# Patient Record
Sex: Female | Born: 1985 | Race: White | Hispanic: No | Marital: Single | Smoking: Never smoker
Health system: Southern US, Community
[De-identification: ages and names within clinical notes are randomized; demographics above are authoritative.]

---

## 2016-06-23 ENCOUNTER — Emergency Department (HOSPITAL_COMMUNITY): Payer: BLUE CROSS/BLUE SHIELD

## 2016-06-23 ENCOUNTER — Encounter (HOSPITAL_COMMUNITY): Payer: Self-pay | Admitting: Emergency Medicine

## 2016-06-23 ENCOUNTER — Emergency Department (HOSPITAL_COMMUNITY)
Admission: EM | Admit: 2016-06-23 | Discharge: 2016-06-23 | Disposition: A | Discharge status: Home / Self Care | Payer: BLUE CROSS/BLUE SHIELD | Attending: Emergency Medicine | Admitting: Emergency Medicine

## 2016-06-23 DIAGNOSIS — R0789 Other chest pain: Secondary | ICD-10-CM | POA: Diagnosis not present

## 2016-06-23 DIAGNOSIS — Z79899 Other long term (current) drug therapy: Secondary | ICD-10-CM | POA: Insufficient documentation

## 2016-06-23 DIAGNOSIS — R079 Chest pain, unspecified: Secondary | ICD-10-CM

## 2016-06-23 LAB — D-DIMER, QUANTITATIVE: D-Dimer, Quant: <0.27 ug/mL-FEU (ref 0.00–0.50)

## 2016-06-23 LAB — BASIC METABOLIC PANEL
ANION GAP: 7 (ref 5–15)
BUN: 12 mg/dL (ref 6–20)
CHLORIDE: 106 mmol/L (ref 101–111)
CO2: 25 mmol/L (ref 22–32)
Calcium: 9.8 mg/dL (ref 8.9–10.3)
Creatinine, Ser: 0.86 mg/dL (ref 0.44–1.00)
GFR calc non Af Amer: >60 mL/min (ref >60)
Glucose, Bld: 95 mg/dL (ref 65–99)
POTASSIUM: 3.8 mmol/L (ref 3.5–5.1)
Sodium: 138 mmol/L (ref 135–145)

## 2016-06-23 LAB — I-STAT TROPONIN, ED: Troponin i, poc: 0 ng/mL (ref 0.00–0.08)

## 2016-06-23 LAB — CBC
HCT: 39 % (ref 36.0–46.0)
HEMOGLOBIN: 13.4 g/dL (ref 12.0–15.0)
MCH: 26.9 pg (ref 26.0–34.0)
MCHC: 34.4 g/dL (ref 30.0–36.0)
MCV: 78.2 fL (ref 78.0–100.0)
Platelets: 290 10*3/uL (ref 150–400)
RBC: 4.99 MIL/uL (ref 3.87–5.11)
RDW: 12.9 % (ref 11.5–15.5)
WBC: 10.7 10*3/uL — ABNORMAL HIGH (ref 4.0–10.5)

## 2016-06-23 MED ORDER — PREDNISONE 20 MG PO TABS: 40.0000 mg | ORAL_TABLET | Freq: Every day | ORAL | Status: AC

## 2016-06-23 MED ORDER — ALBUTEROL SULFATE HFA 108 (90 BASE) MCG/ACT IN AERS
2.0000 | INHALATION_SPRAY | Freq: Once | RESPIRATORY_TRACT | Status: AC
  Administered 2016-06-23: 2 via RESPIRATORY_TRACT
  Filled 2016-06-23: qty 6.7

## 2016-06-23 MED ORDER — NAPROXEN 500 MG PO TABS
500.0000 mg | ORAL_TABLET | Freq: Once | ORAL | Status: AC
  Administered 2016-06-23: 500 mg via ORAL
  Filled 2016-06-23: qty 1

## 2016-06-23 MED ORDER — IPRATROPIUM-ALBUTEROL 0.5-2.5 (3) MG/3ML IN SOLN
3.0000 mL | Freq: Once | RESPIRATORY_TRACT | Status: AC
  Administered 2016-06-23: 3 mL via RESPIRATORY_TRACT
  Filled 2016-06-23: qty 3

## 2016-06-23 MED ORDER — LORAZEPAM 1 MG PO TABS: 0.5000 mg | ORAL_TABLET | Freq: Three times a day (TID) | ORAL | Status: AC | PRN

## 2016-06-23 NOTE — ED Provider Notes (Signed)
CSN: [id number]     Arrival date & time 06/23/16  0026 History   First MD Initiated Contact with Patient 06/23/16 0059     Chief Complaint  Patient presents with  . Chest Pain  . Anxiety     (Consider location/radiation/quality/duration/timing/severity/associated sxs/prior Treatment) HPI Comments: 30 year old female with no significant past medical history presents to the emergency department for evaluation of central chest pain, described as a pressure, which has been constant since yesterday morning upon waking. Patient states that pain is slightly worse with deep breathing. Symptoms were preceded by nasal congestion and rhinorrhea which the patient attributed to allergies. She states that her pain and pressure sensation worsened this evening. She is unsure of whether her symptoms may be secondary to anxiety. She denies taking any medications for anxiety on a regular basis. She states that she has been eating regularly and sleeping 7-8 hours a night. She does not feel as though she is under increased stress. She has had no associated fever, syncope, hemoptysis, leg swelling, lightheadedness, or cough. No recent surgeries or hospitalizations. No personal or family history of DVT/PE. Patient is on birth control.  Patient is a 30 y.o. female presenting with chest pain and anxiety. The history is provided by the patient. No language interpreter was used.  Chest Pain Associated symptoms: anxiety   Associated symptoms: no fever and not vomiting   Anxiety Associated symptoms include chest pain and congestion. Pertinent negatives include no fever or vomiting.    History reviewed. No pertinent past medical history. History reviewed. No pertinent past surgical history. No family history on file. Social History  Substance Use Topics  . Smoking status: Never Smoker   . Smokeless tobacco: None  . Alcohol Use: 1.8 oz/week    3 Shots of liquor per week   OB History    No data available       Review of Systems  Constitutional: Negative for fever.  HENT: Positive for congestion.   Respiratory: Positive for chest tightness.   Cardiovascular: Positive for chest pain. Negative for leg swelling.  Gastrointestinal: Negative for vomiting and diarrhea.  Neurological: Negative for syncope and light-headedness.  All other systems reviewed and are negative.   Allergies  Sulfa antibiotics  Home Medications   Prior to Admission medications   Medication Sig Start Date End Date Taking? Authorizing Provider  etonogestrel (NEXPLANON) 68 MG IMPL implant 1 each by Subdermal route once.   Yes Historical Provider, MD  Multiple Vitamin (MULTIVITAMIN WITH MINERALS) TABS tablet Take 1 tablet by mouth daily.   Yes Historical Provider, MD   BP 160/94 mmHg  Pulse 102  Temp(Src) 98.2 F (36.8 C) (Oral)  Resp 15  Ht 5\' 7"  (1.702 m)  Wt 120.203 kg  BMI 41.50 kg/m2  SpO2 99%   Physical Exam  Constitutional: She is oriented to person, place, and time. She appears well-developed and well-nourished. No distress.  Nontoxic appearing and in no distress.  HENT:  Head: Normocephalic and atraumatic.  Eyes: Conjunctivae and EOM are normal. No scleral icterus.  Neck: Normal range of motion.  Cardiovascular: Regular rhythm and intact distal pulses.   Borderline tachycardia  Pulmonary/Chest: Effort normal and breath sounds normal. No respiratory distress. She has no wheezes. She has no rales.  Respirations even and unlabored. Lungs clear to auscultation bilaterally.  Musculoskeletal: Normal range of motion.  Neurological: She is alert and oriented to person, place, and time. She exhibits normal muscle tone. Coordination normal.  Skin: Skin is warm and  dry. No rash noted. She is not diaphoretic. No erythema. No pallor.  Psychiatric: She has a normal mood and affect. Her behavior is normal.  Nursing note and vitals reviewed.   ED Course  Procedures (including critical care time) Labs  Review Labs Reviewed  CBC - Abnormal; Notable for the following:    WBC 10.7 (*)    All other components within normal limits  BASIC METABOLIC PANEL  D-DIMER, QUANTITATIVE ([facility])  Rosezena SensorI-STAT TROPOININ, ED    Imaging Review Dg Chest 2 View  06/23/2016  CLINICAL DATA:  Initial evaluation for acute mid upper chest pain with shortness of breath. EXAM: CHEST  2 VIEW COMPARISON:  None. FINDINGS: The cardiac and mediastinal silhouettes are within normal limits. The lungs are normally inflated. Mild scattered peribronchial thickening. No airspace consolidation, pleural effusion, or pulmonary edema is identified. There is no pneumothorax. No acute osseous abnormality identified. IMPRESSION: Mild scattered peribronchial thickening, which may reflect acute bronchiolitis and/ or reactive airways disease. Electronically Signed   By: Rise MuBenjamin  McClintock M.D.   On: 06/23/2016 01:14   I have personally reviewed and evaluated these images and lab results as part of my medical decision-making.   ED ECG REPORT   Date: 06/23/2016  Rate: 100  Rhythm: sinus tachycardia  QRS Axis: normal  Intervals: Normal QTc  ST/T Wave abnormalities: normal  Conduction Disutrbances:none  Narrative Interpretation: sinus tachycardia; no STEMI  Old EKG Reviewed: none available  I have personally reviewed the EKG tracing and agree with the computerized printout as noted.    0230 - Heart score 0-1 depending upon suspicion of history c/w low risk of acute coronary event. [facility]; will dimer.  MDM   Final diagnoses:  Chest pain, unspecified chest pain type    30 year old female presents to the emergency department for evaluation of one day of central chest tightness and pressure. Symptoms preceded by nasal congestion. X-ray consistent with acute bronchiolitis which may account for patient's symptoms today as well as her mild leukocytosis of 10.7. Patient is a young, otherwise healthy female. She has no risk  factors for cardiovascular disease. Heart score is 0-1 depending on suspicion, correlated with low risk of acute coronary event. She has a negative cardiac workup today with a nonischemic EKG and negative troponin. D-dimer also obtained which is negative. Patient is, overall, low risk for PE.  Symptoms managed in the emergency department with naproxen as well as a DuoNeb treatment. Patient has had some improvement with this, but continues to complain of tightness. Her vitals are stable. She has no hypoxia. Heart rate has improved from arrival. I do not believe further emergent workup is indicated. It is also possible that there may be a slight underlying anxiety component as patient does have a history of panic attacks. Primary care follow-up advised and return precautions given. Patient discharged in satisfactory condition with instructions for supportive care.   Filed Vitals:   06/23/16 0243 06/23/16 0245 06/23/16 0257 06/23/16 0415  BP:  119/89 139/92 134/95  Pulse:  90 95 82  Temp:      TempSrc:      Resp:  15 13 22   Height:      Weight:      SpO2: 100% 100% 99% 100%     Antony MaduraKelly Lyon Dumont, PA-C 06/23/16 0710  Rolland PorterMark James, MD 07/06/16 1407

## 2016-06-23 NOTE — ED Notes (Signed)
Pt given discharge instructions verbalized understanding of need to follow up, reasons to return to the ED and medications to take at home. Inhaler instructions provided and pt and to show teach back. IV removed intact. Site clean and dry. VSS. Pt able to ambulate to exit without difficulty, moving all extremities well. Pt denied further questions or concerns at time of discharge.

## 2016-06-23 NOTE — ED Notes (Signed)
RT called for neb treatment. En route at this time.

## 2016-06-23 NOTE — Discharge Instructions (Signed)
Nonspecific Chest Pain  °Chest pain can be caused by many different conditions. There is always a chance that your pain could be related to something serious, such as a heart attack or a blood clot in your lungs. Chest pain can also be caused by conditions that are not life-threatening. If you have chest pain, it is very important to follow up with your health care provider. °CAUSES  °Chest pain can be caused by: °· Heartburn. °· Pneumonia or bronchitis. °· Anxiety or stress. °· Inflammation around your heart (pericarditis) or lung (pleuritis or pleurisy). °· A blood clot in your lung. °· A collapsed lung (pneumothorax). It can develop suddenly on its own (spontaneous pneumothorax) or from trauma to the chest. °· Shingles infection (varicella-zoster virus). °· Heart attack. °· Damage to the bones, muscles, and cartilage that make up your chest wall. This can include: °¨ Bruised bones due to injury. °¨ Strained muscles or cartilage due to frequent or repeated coughing or overwork. °¨ Fracture to one or more ribs. °¨ Sore cartilage due to inflammation (costochondritis). °RISK FACTORS  °Risk factors for chest pain may include: °· Activities that increase your risk for trauma or injury to your chest. °· Respiratory infections or conditions that cause frequent coughing. °· Medical conditions or overeating that can cause heartburn. °· Heart disease or family history of heart disease. °· Conditions or health behaviors that increase your risk of developing a blood clot. °· Having had chicken pox (varicella zoster). °SIGNS AND SYMPTOMS °Chest pain can feel like: °· Burning or tingling on the surface of your chest or deep in your chest. °· Crushing, pressure, aching, or squeezing pain. °· Dull or sharp pain that is worse when you move, cough, or take a deep breath. °· Pain that is also felt in your back, neck, shoulder, or arm, or pain that spreads to any of these areas. °Your chest pain may come and go, or it may stay  constant. °DIAGNOSIS °Lab tests or other studies may be needed to find the cause of your pain. Your health care provider may have you take a test called an ambulatory ECG (electrocardiogram). An ECG records your heartbeat patterns at the time the test is performed. You may also have other tests, such as: °· Transthoracic echocardiogram (TTE). During echocardiography, sound waves are used to create a picture of all of the heart structures and to look at how blood flows through your heart. °· Transesophageal echocardiogram (TEE). This is a more advanced imaging test that obtains images from inside your body. It allows your health care provider to see your heart in finer detail. °· Cardiac monitoring. This allows your health care provider to monitor your heart rate and rhythm in real time. °· Holter monitor. This is a portable device that records your heartbeat and can help to diagnose abnormal heartbeats. It allows your health care provider to track your heart activity for several days, if needed. °· Stress tests. These can be done through exercise or by taking medicine that makes your heart beat more quickly. °· Blood tests. °· Imaging tests. °TREATMENT  °Your treatment depends on what is causing your chest pain. Treatment may include: °· Medicines. These may include: °¨ Acid blockers for heartburn. °¨ Anti-inflammatory medicine. °¨ Pain medicine for inflammatory conditions. °¨ Antibiotic medicine, if an infection is present. °¨ Medicines to dissolve blood clots. °¨ Medicines to treat coronary artery disease. °· Supportive care for conditions that do not require medicines. This may include: °¨ Resting. °¨ Applying heat   or cold packs to injured areas. °¨ Limiting activities until pain decreases. °HOME CARE INSTRUCTIONS °· If you were prescribed an antibiotic medicine, finish it all even if you start to feel better. °· Avoid any activities that bring on chest pain. °· Do not use any tobacco products, including  cigarettes, chewing tobacco, or electronic cigarettes. If you need help quitting, ask your health care provider. °· Do not drink alcohol. °· Take medicines only as directed by your health care provider. °· Keep all follow-up visits as directed by your health care provider. This is important. This includes any further testing if your chest pain does not go away. °· If heartburn is the cause for your chest pain, you may be told to keep your head raised (elevated) while sleeping. This reduces the chance that acid will go from your stomach into your esophagus. °· Make lifestyle changes as directed by your health care provider. These may include: °¨ Getting regular exercise. Ask your health care provider to suggest some activities that are safe for you. °¨ Eating a heart-healthy diet. A registered dietitian can help you to learn healthy eating options. °¨ Maintaining a healthy weight. °¨ Managing diabetes, if necessary. °¨ Reducing stress. °SEEK MEDICAL CARE IF: °· Your chest pain does not go away after treatment. °· You have a rash with blisters on your chest. °· You have a fever. °SEEK IMMEDIATE MEDICAL CARE IF:  °· Your chest pain is worse. °· You have an increasing cough, or you cough up blood. °· You have severe abdominal pain. °· You have severe weakness. °· You faint. °· You have chills. °· You have sudden, unexplained chest discomfort. °· You have sudden, unexplained discomfort in your arms, back, neck, or jaw. °· You have shortness of breath at any time. °· You suddenly start to sweat, or your skin gets clammy. °· You feel nauseous or you vomit. °· You suddenly feel light-headed or dizzy. °· Your heart begins to beat quickly, or it feels like it is skipping beats. °These symptoms may represent a serious problem that is an emergency. Do not wait to see if the symptoms will go away. Get medical help right away. Call your local emergency services (911 in the [place] Do not drive yourself to the hospital. °  °This  information is not intended to replace advice given to you by your health care provider. Make sure you discuss any questions you have with your health care provider. °  °Document Released: 09/19/2005 Document Revised: 12/31/2014 Document Reviewed: 07/16/2014 °Elsevier Interactive Patient Education ©2016 [employer] ° °

## 2016-06-23 NOTE — ED Notes (Signed)
Kelly PA at bedside   

## 2016-06-23 NOTE — ED Notes (Signed)
Pt from home with complaints of central chest pain that she initially thought was related to anxiety. Pt states she is now unsure if it is anxiety because the pain has not gone away. Pt has clear lung sounds, good cap refill, and strong bilateral radial pulses. Pt has normal S1 and S2 heart sounds. Pt currently rates her pain at 7/10. Pt denies nausea, lightheadedness, or other symptoms. Pt denies radiation to other areas.

## 2017-05-06 IMAGING — CR DG CHEST 2V
2 series · 2 of 2 positions shown · non-contrast
Comparison: None.

CLINICAL DATA: Initial evaluation for acute mid upper chest pain
with shortness of breath.

EXAM:
CHEST  2 VIEW

[w chest pa]
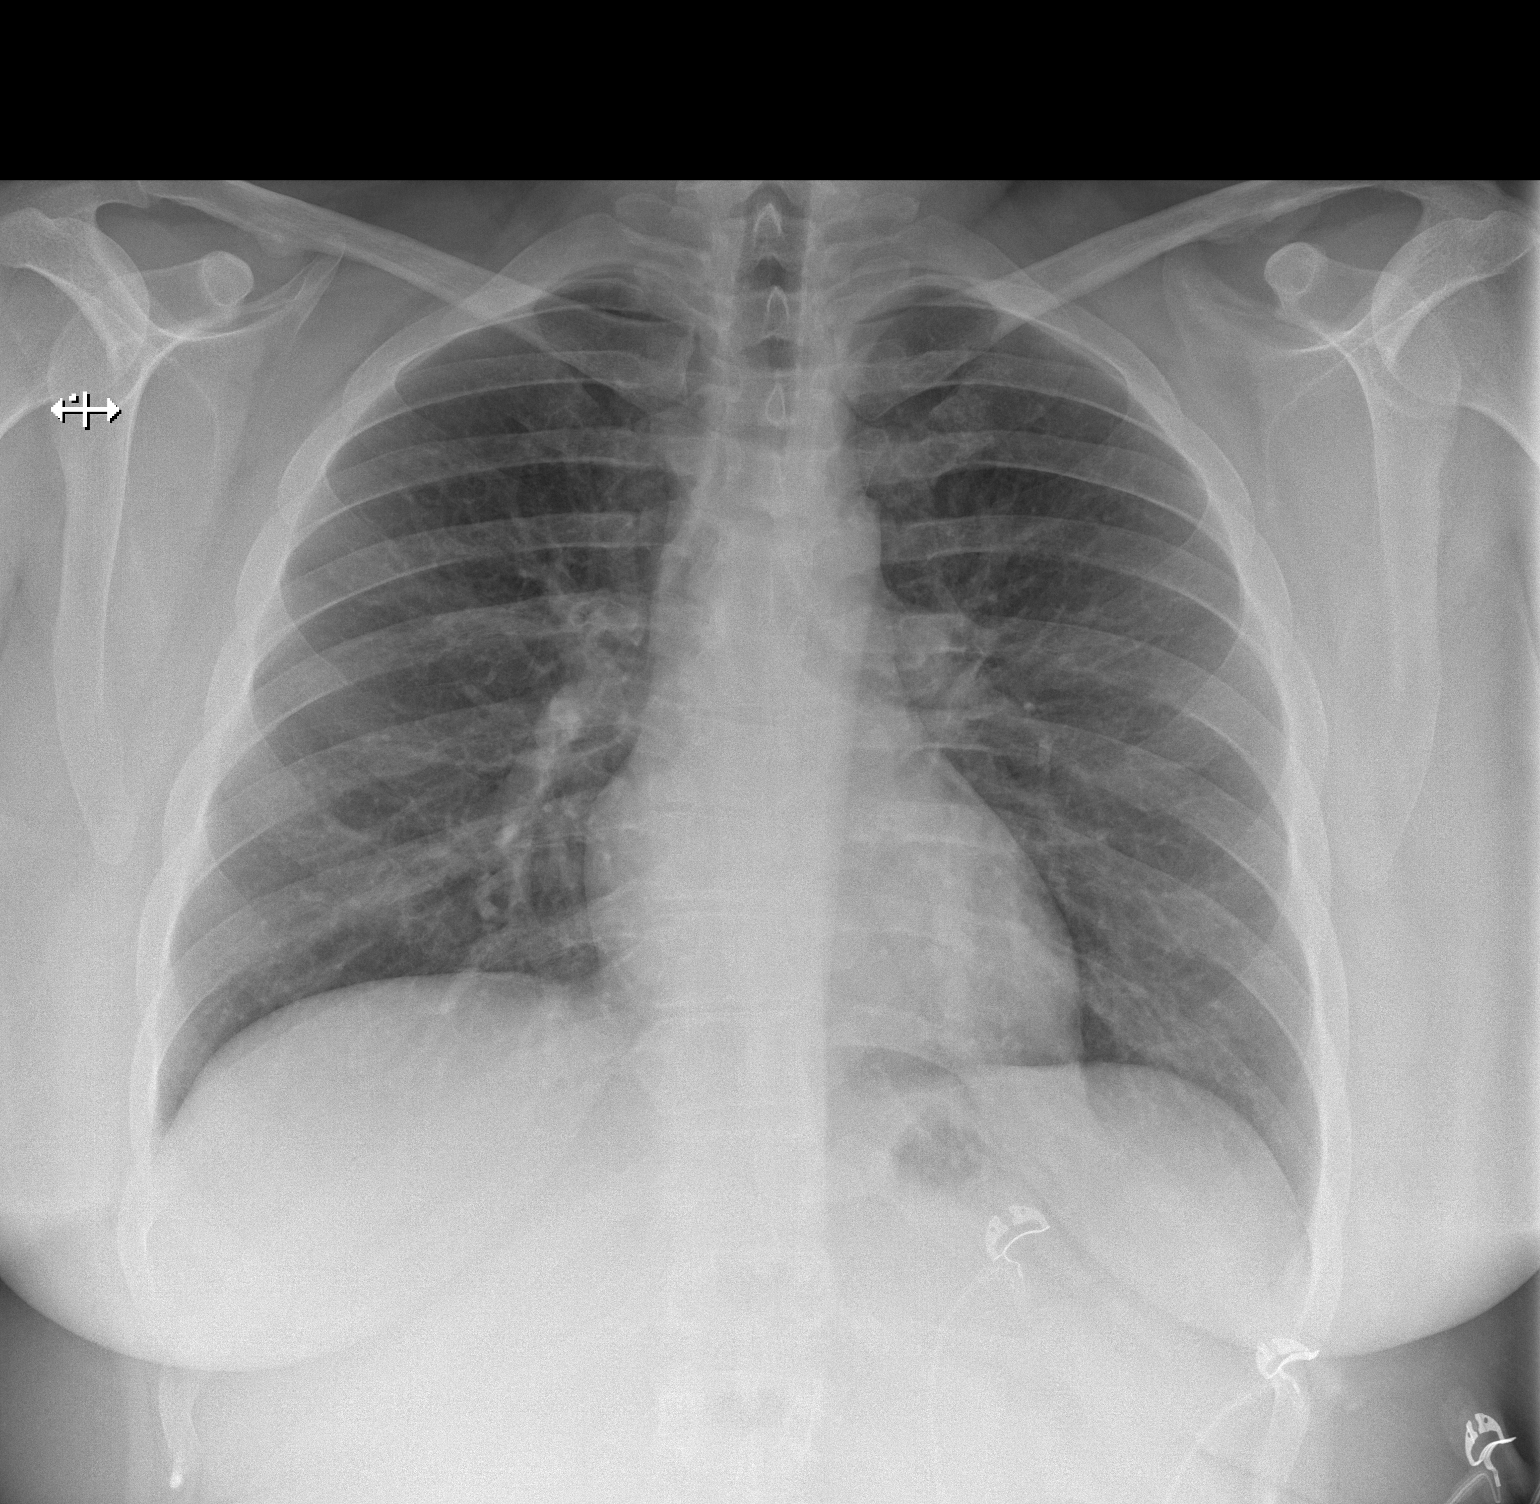

[w chest lat]
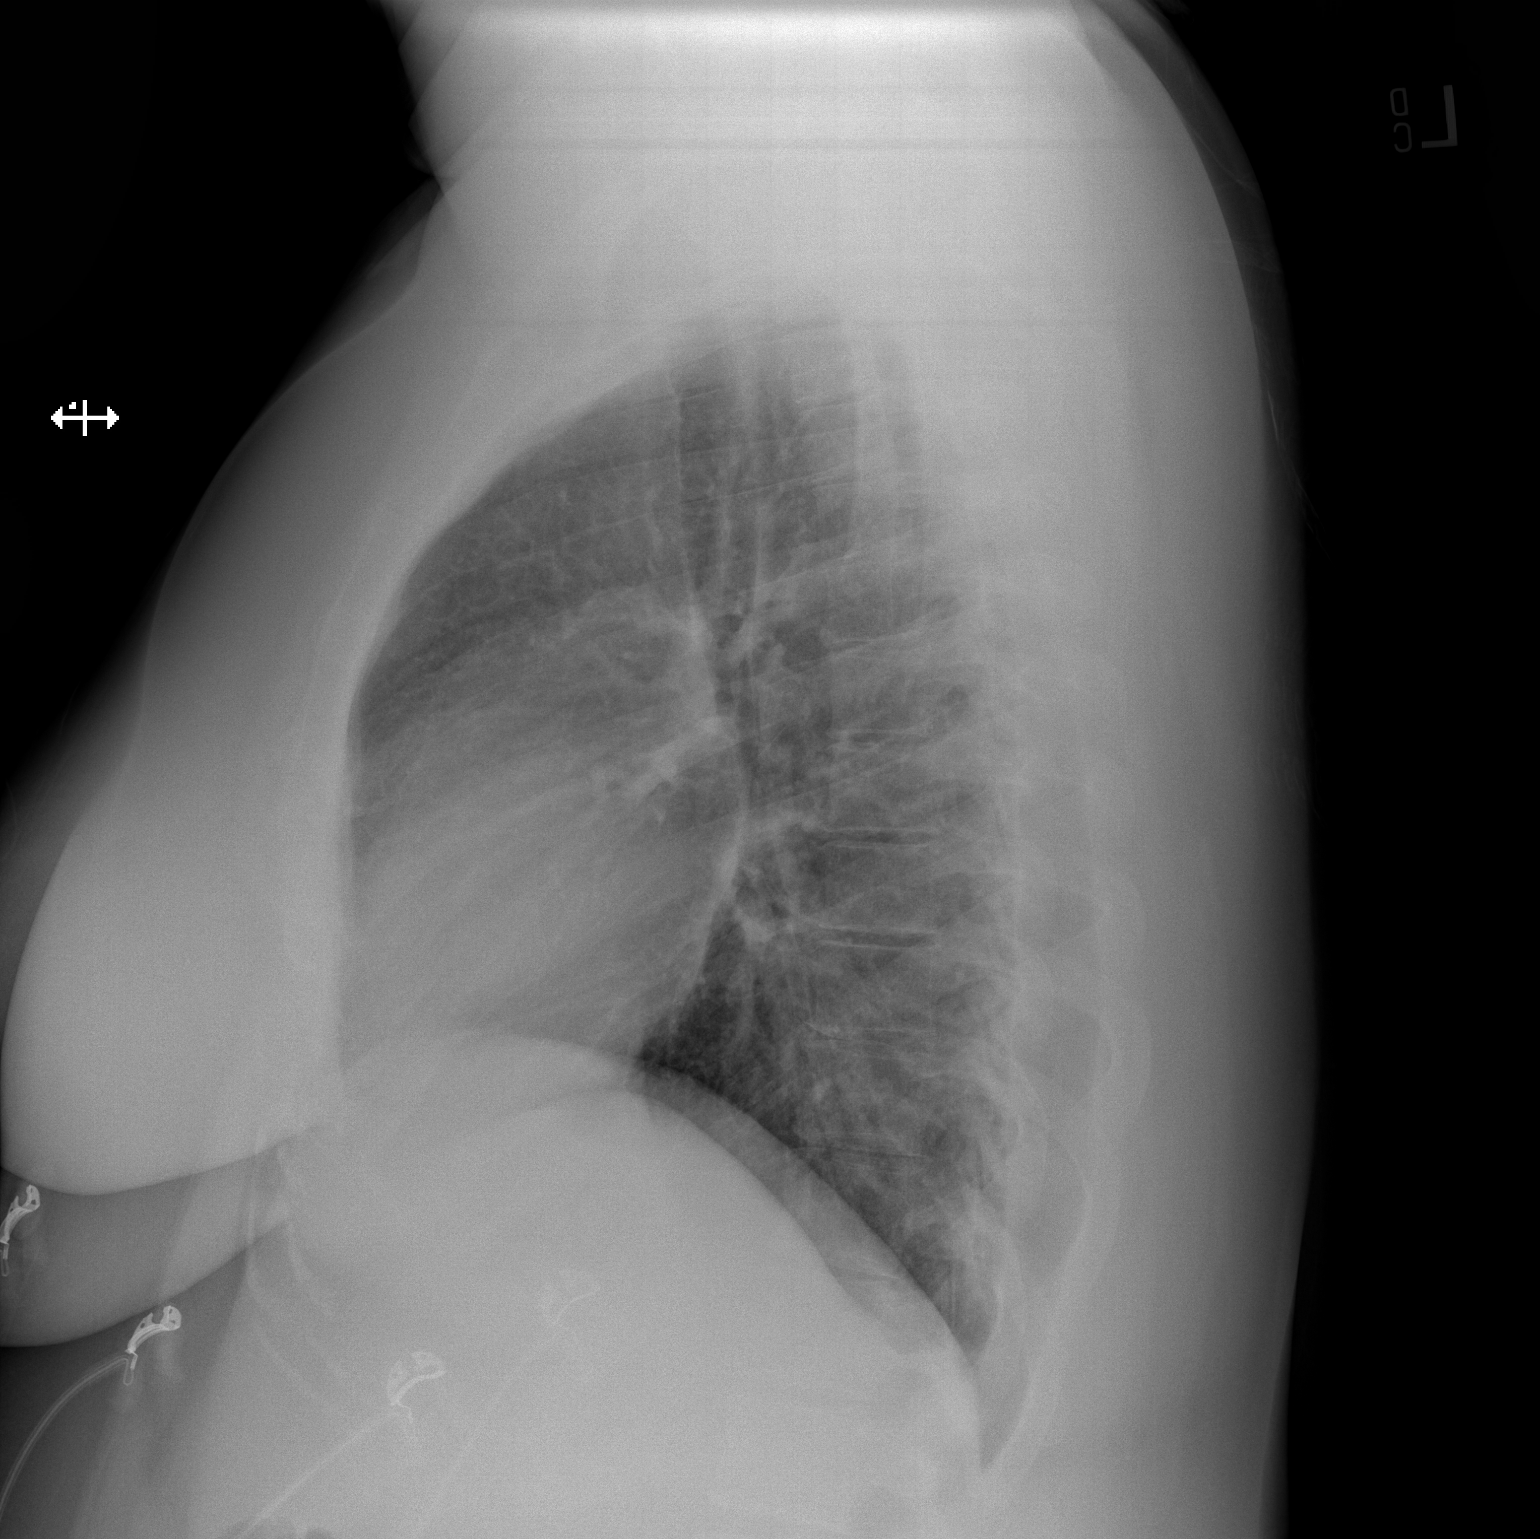

[2 of 2 positions shown; findings below may reference images not displayed]

FINDINGS: The cardiac and mediastinal silhouettes are within normal limits.

The lungs are normally inflated. Mild scattered peribronchial
thickening. No airspace consolidation, pleural effusion, or
pulmonary edema is identified. There is no pneumothorax.

No acute osseous abnormality identified.
IMPRESSION: Mild scattered peribronchial thickening, which may reflect acute
bronchiolitis and/ or reactive airways disease.
